# Patient Record
Sex: Female | Born: 1970 | Race: White | Hispanic: No | Smoking: Never smoker
Health system: Southern US, Community
[De-identification: ages and names within clinical notes are randomized; demographics above are authoritative.]

## PROBLEM LIST (undated history)

## (undated) DIAGNOSIS — F988 Other specified behavioral and emotional disorders with onset usually occurring in childhood and adolescence: Secondary | ICD-10-CM

## (undated) DIAGNOSIS — F419 Anxiety disorder, unspecified: Secondary | ICD-10-CM

## (undated) HISTORY — DX: Other specified behavioral and emotional disorders with onset usually occurring in childhood and adolescence: F98.8

## (undated) HISTORY — DX: Anxiety disorder, unspecified: F41.9

---

## 1998-01-26 ENCOUNTER — Other Ambulatory Visit: Admission: RE | Admit: 1998-01-26 | Discharge: 1998-01-26 | Payer: Self-pay | Admitting: Dermatology

## 1998-02-28 ENCOUNTER — Other Ambulatory Visit: Admission: RE | Admit: 1998-02-28 | Discharge: 1998-02-28 | Payer: Self-pay | Admitting: Dermatology

## 1998-03-30 ENCOUNTER — Other Ambulatory Visit: Admission: RE | Admit: 1998-03-30 | Discharge: 1998-03-30 | Payer: Self-pay | Admitting: Dermatology

## 1998-04-28 ENCOUNTER — Other Ambulatory Visit: Admission: RE | Admit: 1998-04-28 | Discharge: 1998-04-28 | Payer: Self-pay | Admitting: Dermatology

## 1998-08-07 ENCOUNTER — Other Ambulatory Visit: Admission: RE | Admit: 1998-08-07 | Discharge: 1998-08-07 | Payer: Self-pay | Admitting: Obstetrics and Gynecology

## 2000-01-29 ENCOUNTER — Other Ambulatory Visit: Admission: RE | Admit: 2000-01-29 | Discharge: 2000-01-29 | Payer: Self-pay | Admitting: Obstetrics and Gynecology

## 2001-03-23 ENCOUNTER — Other Ambulatory Visit: Admission: RE | Admit: 2001-03-23 | Discharge: 2001-03-23 | Payer: Self-pay | Admitting: *Deleted

## 2001-03-30 ENCOUNTER — Encounter: Admission: RE | Admit: 2001-03-30 | Discharge: 2001-03-30 | Payer: Self-pay | Admitting: Obstetrics and Gynecology

## 2001-03-30 ENCOUNTER — Encounter: Payer: Self-pay | Admitting: Obstetrics and Gynecology

## 2002-05-24 ENCOUNTER — Other Ambulatory Visit: Admission: RE | Admit: 2002-05-24 | Discharge: 2002-05-24 | Payer: Self-pay | Admitting: Obstetrics and Gynecology

## 2004-04-05 ENCOUNTER — Emergency Department (HOSPITAL_COMMUNITY): Admission: EM | Admit: 2004-04-05 | Discharge: 2004-04-05 | Payer: Self-pay | Admitting: Family Medicine

## 2004-07-10 ENCOUNTER — Other Ambulatory Visit: Admission: RE | Admit: 2004-07-10 | Discharge: 2004-07-10 | Payer: Self-pay | Admitting: Obstetrics and Gynecology

## 2005-10-16 ENCOUNTER — Other Ambulatory Visit: Admission: RE | Admit: 2005-10-16 | Discharge: 2005-10-16 | Payer: Self-pay | Admitting: Gynecology

## 2006-05-06 ENCOUNTER — Inpatient Hospital Stay (HOSPITAL_COMMUNITY): Admission: RE | Admit: 2006-05-06 | Discharge: 2006-05-09 | Payer: Self-pay | Admitting: Gynecology

## 2006-06-18 ENCOUNTER — Other Ambulatory Visit: Admission: RE | Admit: 2006-06-18 | Discharge: 2006-06-18 | Payer: Self-pay | Admitting: Gynecology

## 2006-06-27 ENCOUNTER — Encounter: Admission: RE | Admit: 2006-06-27 | Discharge: 2006-07-26 | Payer: Self-pay | Admitting: Gynecology

## 2006-07-27 ENCOUNTER — Encounter: Admission: RE | Admit: 2006-07-27 | Discharge: 2006-08-26 | Payer: Self-pay | Admitting: Gynecology

## 2006-08-27 ENCOUNTER — Encounter: Admission: RE | Admit: 2006-08-27 | Discharge: 2006-09-25 | Payer: Self-pay | Admitting: Gynecology

## 2006-09-26 ENCOUNTER — Encounter: Admission: RE | Admit: 2006-09-26 | Discharge: 2006-10-26 | Payer: Self-pay | Admitting: Gynecology

## 2006-10-27 ENCOUNTER — Encounter: Admission: RE | Admit: 2006-10-27 | Discharge: 2006-11-26 | Payer: Self-pay | Admitting: Gynecology

## 2006-11-27 ENCOUNTER — Encounter: Admission: RE | Admit: 2006-11-27 | Discharge: 2006-12-08 | Payer: Self-pay | Admitting: Gynecology

## 2008-10-20 ENCOUNTER — Other Ambulatory Visit: Admission: RE | Admit: 2008-10-20 | Discharge: 2008-10-20 | Payer: Self-pay | Admitting: Obstetrics and Gynecology

## 2011-03-01 NOTE — Discharge Summary (Signed)
Kristen Wood, Kristen Wood                    ACCOUNT NO.:  [id number]   MEDICAL RECORD NO.:  [id number]          PATIENT TYPE:  INP   LOCATION:  9128                          FACILITY:  [facility]   PHYSICIAN:  Ivor Costa. Farrel Gobble, M.D. DATE OF BIRTH:  January 01, 1971   DATE OF ADMISSION:  05/06/2006  DATE OF DISCHARGE:  05/09/2006                                 DISCHARGE SUMMARY   PRINCIPAL DIAGNOSIS:  Suspected cephalopelvic disproportion.   PRINCIPAL PROCEDURE:  Primary cesarean section.   HOSPITAL COURSE:  The patient presented in the afternoon of May 06, 2006,  to undergo a primary cesarean section that was elected secondary to fetal  head that measured greater than 42 weeks.  The patient underwent C-section  under spinal anesthesia for delivery of a viable female in the vertex  presentation with Apgars 9 and 9, birthweight 8#15, normal uterus, tubes and  ovaries and estimated blood loss of approximately 700 cc.  She was then  transferred to the recovery room in the postpartum floor in due fashion.  Her postpartum course was unremarkable.  The patient remained without  complaints.  By postop day #3, the patient was ready for discharge.  She was  voiding freely, tolerating regular diet and tolerating oral pain  medications.  The patient remained afebrile and vitals stable throughout.  Her uterus was firm, below the umbilicus.  Her incision was clean, dry and  intact.  Her extremities were nontender.  Postop labs were hemoglobin was  12, hematocrit 34.2, platelets 197,000, white count 8.5.  Postop discharge  condition was stable.  The patient was given discharge medications.  The  patient will use over-the-counter Motrin.  She was also given a prescription  for Tylox 1-2 q.6 h p.r.n. pain.      Ivor Costa. Farrel Gobble, M.D.  Electronically Signed     THL/MEDQ  D:  05/09/2006  T:  05/09/2006  Job:  [id number]

## 2011-03-01 NOTE — H&P (Signed)
Kristen Wood, Kristen Wood                    ACCOUNT NO.:  [id number]   MEDICAL RECORD NO.:  [id number]          PATIENT TYPE:  INP   LOCATION:  NA                            [facility]   PHYSICIAN:  Ivor Costa. Farrel Gobble, M.D. DATE OF BIRTH:  Feb 14, 1971   DATE OF ADMISSION:  DATE OF DISCHARGE:                                HISTORY & PHYSICAL   CHIEF COMPLAINT:  Suspected cephalopelvic disproportion.   HISTORY OF PRESENT ILLNESS:  The patient is a 40 year old, G-1 with  estimated date of confinement of 05/04/2006.  Estimated gestational age of  20 and 2/7 weeks, who presented last week to the office for a routine OB  visit.  The patient's vertex was noted to be high and when in the upright  position seemed to be overriding the pubic symphysis.  Review of the chart  had shown that an ultrasound done earlier at 36 weeks, showed the head  circumference BPD and OFD to be greater than the 98th percentile throughout,  the femur length only being in the 44th percentile.  Because of that the  patient then had an ultrasound done which again shows the head circumference  to be greater than 99th percentile, again with the femur length being 39th  percent.  With the vertex overriding the pubic symphysis and the large head  circumference, the patient was offered a Cesarean section.  She later called  back and wished to schedule the surgery.  The patient is otherwise without  any complaints, reports good fetal movement.  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  She is Rh negative as is  her husband.  Refer to the [place].  She is B negative, antibody negative,  RPR nonreactive, rubella negative.  Hepatitis B surface antigen nonreactive,  HIV nonreactive, GBS positive.  Refer to the [place].   PHYSICAL EXAMINATION:  She is a well-appearing gravida in no acute distress.  Her heart was regular rate.  Her lungs were clear.  Her abdomen was obese,  soft and nontender and  fetal heart tones were auscultated.  Her fundal  height was 39.  Again the vertex overrode the pubic symphysis.  On  examination she was fingertip, 70, very posterior and very high.  Extremities:  Negative.   ASSESSMENT:  Suspected cephalopelvic disproportion.  The patient will  present now for a primary Cesarean section.  Risks and benefits were  reviewed.  The patient is aware that there is a chance that she could  deliver this baby vaginally but does not elect to do so at this point.  All  questions were addressed.      Ivor Costa. Farrel Gobble, M.D.  Electronically Signed     THL/MEDQ  D:  05/06/2006  T:  05/06/2006  Job:  [id number]

## 2011-03-01 NOTE — Op Note (Signed)
NAMEJESSIAH, Kristen Wood                    ACCOUNT NO.:  [id number]   MEDICAL RECORD NO.:  [id number]          PATIENT TYPE:  INP   LOCATION:  9128                          [facility]   PHYSICIAN:  Ivor Costa. Farrel Gobble, M.D. DATE OF BIRTH:  [birth year]   DATE OF PROCEDURE:  05/06/2006  DATE OF DISCHARGE:                                 OPERATIVE REPORT   PREOPERATIVE DIAGNOSIS:  Suspected cephalopelvic disproportion.   POSTOPERATIVE DIAGNOSIS:  Suspected cephalopelvic disproportion.   PROCEDURE:  Primary cesarean section, low flap transverse.   SURGEON:  Ivor Costa. Farrel Gobble, M.D.   ANESTHESIA:  Spinal.   IV FLUIDS:  2600 mL lactated Ringer's.   ESTIMATED BLOOD LOSS:  700 mL.   URINE OUTPUT:  325 mL clear urine.   FINDINGS:  A viable female infant, clear amniotic fluid, Apgars 9/9, birth  weight 7 pounds 15 ounces.  Normal uterus, tubes and ovaries.   COMPLICATIONS:  None.   PATHOLOGY:  Placenta.   PROCEDURE:  The patient was taken to operating room, spinal anesthesia was  induced, and placed in the supine position left lateral displacement,  prep  and drape in the usual sterile fashion.  After adequate anesthesia was  assured, a Pfannenstiel skin incision was made with scalpel and carried  through the underlying layer of fascia with the Bovie.  The fascia was  scored in the midline and the incision was extended laterally with the  Bovie.  The inferior aspect of the fascial incision was grasped with Kochers  and the underlying rectus muscles were dissected off by blunt and sharp  dissection.  In a similar fashion, the superior aspect of the incision was  grasped with Kochers and the underlying rectus muscles were dissected off.  The rectus muscles were separated in the midline.  The peritoneum was  identified and entered bluntly.  The peritoneal incision was then extended  superiorly and inferiorly with good visualization of the underlying bowel  and bladder.  The bladder blade was  inserted.  The vesicouterine peritoneum  was identified, tented up, and entered sharply with the Metzenbaum scissors.  The incision was extended laterally.  A bladder flap was created digitally.  The bladder blade was then reinserted.  The lower uterine segment was  incised in a transverse fashion with the scalpel.  The incision was extended  bluntly.  The infant was delivered with the aid of baby Elliott's.  The cord  was cut and clamped and the infant was off to the awaiting pediatricians.  Cord bloods were obtained.  The uterus was massaged.  The placenta separated  naturally.  The uterus was then cleared of all clots and debris.  The  uterine incision was repaired with a running locked layer of 0 chromic, a  second suture was used for imbrication.  The pelvis was then irrigated with  copious amounts of warm saline.  The incision was noted to be hemostatic.  The tubes and ovaries were noted to be unremarkable.  The peritoneum,  muscles, and fascia were also noted to be hemostatic.  The fascia  was closed  with a 0 Vicryl in a running fashion.  The subcu was irrigated and  reapproximated with 3-0 plain.  The skin was closed with 4-0 Vicryl on a  Mellody Dance.  The patient tolerated the procedure well.  Sponge and needle counts  correct x2.  She was transferred to the PACU in stable condition.  She  received Ancef at cord clamp.      Ivor Costa. Farrel Gobble, M.D.  Electronically Signed     THL/MEDQ  D:  05/06/2006  T:  05/06/2006  Job:  [id number]

## 2012-03-03 ENCOUNTER — Other Ambulatory Visit: Payer: Self-pay | Admitting: Obstetrics and Gynecology

## 2012-03-03 DIAGNOSIS — Z1231 Encounter for screening mammogram for malignant neoplasm of breast: Secondary | ICD-10-CM

## 2012-03-31 ENCOUNTER — Ambulatory Visit
Admission: RE | Admit: 2012-03-31 | Discharge: 2012-03-31 | Disposition: A | Discharge status: Home / Self Care | Payer: BC Managed Care – PPO | Source: Ambulatory Visit | Attending: Obstetrics and Gynecology | Admitting: Obstetrics and Gynecology

## 2012-03-31 DIAGNOSIS — Z1231 Encounter for screening mammogram for malignant neoplasm of breast: Secondary | ICD-10-CM

## 2013-01-01 ENCOUNTER — Ambulatory Visit (INDEPENDENT_AMBULATORY_CARE_PROVIDER_SITE_OTHER): Payer: BC Managed Care – PPO | Admitting: Physician Assistant

## 2013-01-01 VITALS — BP 122/78 | HR 81 | Temp 98.2°F | Resp 18 | Ht 64.0 in | Wt 159.0 lb

## 2013-01-01 DIAGNOSIS — L03119 Cellulitis of unspecified part of limb: Secondary | ICD-10-CM

## 2013-01-01 DIAGNOSIS — L02519 Cutaneous abscess of unspecified hand: Secondary | ICD-10-CM

## 2013-01-01 DIAGNOSIS — L03114 Cellulitis of left upper limb: Secondary | ICD-10-CM

## 2013-01-01 LAB — POCT CBC
Granulocyte percent: 70.2 %G (ref 37–80)
HCT, POC: 45.8 % (ref 37.7–47.9)
Hemoglobin: 14.6 g/dL (ref 12.2–16.2)
Lymph, poc: 1.7 (ref 0.6–3.4)
MCH, POC: 31.9 pg — AB (ref 27–31.2)
MCHC: 31.9 g/dL (ref 31.8–35.4)
MCV: 99.9 fL — AB (ref 80–97)
MID (cbc): 0.6 (ref 0–0.9)
MPV: 8.6 fL (ref 0–99.8)
POC Granulocyte: 5.5 (ref 2–6.9)
POC LYMPH PERCENT: 22.4 %L (ref 10–50)
POC MID %: 7.4 %M (ref 0–12)
Platelet Count, POC: 271 10*3/uL (ref 142–424)
RBC: 4.58 M/uL (ref 4.04–5.48)
RDW, POC: 13 %
WBC: 7.8 10*3/uL (ref 4.6–10.2)

## 2013-01-01 MED ORDER — MUPIROCIN 2 % EX OINT: TOPICAL_OINTMENT | Freq: Three times a day (TID) | CUTANEOUS | Status: DC

## 2013-01-01 MED ORDER — DOXYCYCLINE HYCLATE 100 MG PO TABS: 100.0000 mg | ORAL_TABLET | Freq: Two times a day (BID) | ORAL | Status: DC

## 2013-01-01 NOTE — Progress Notes (Signed)
   [address], [place] [address]   Phone [phone]  Subjective:    Patient ID: Kristen Wood, female    DOB: [birth year], 42 y.o.   MRN: [id number]  HPI  Pt presents to clinic with area on L hand.  She noticed it yesterday and thinks it might be a spider bite because she moved plants the day before at her house.  She has some tightness in her hand which is causing some pain but no itching.  It is definitely swollen.  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  She feels well overall.  Review of Systems  Constitutional: Negative for fever and chills.  Skin: Positive for wound.       Objective:   Physical Exam  Vitals reviewed. Constitutional: She is oriented to person, place, and time. She appears well-developed and well-nourished.  HENT:  Head: Normocephalic and atraumatic.  Right Ear: External ear normal.  Left Ear: External ear normal.  Pulmonary/Chest: Effort normal.  Neurological: She is alert and oriented to person, place, and time.  Skin: Skin is warm and dry.  Small indurated area with surrounding swelling and erythema.  The center of the wound is covered in necrotic tissue that is removed and no purulence is expressed.  The wound edges are appear like dried granulation tissue.  This does not have the normal appearance of a MRSA infection.  There is a pustule just distal to the main wound that purulence is expressed and cultured.  The erythema is marked.  Psychiatric: She has a normal mood and affect. Her behavior is normal. Judgment and thought content normal.          Assessment & Plan:  Cellulitis of hand, left with pain - Pt to keep covered to protect herself and her clients.  She is to withhold from washing hair and be very careful about chemicals and her wound.  Plan: POCT CBC, Wound culture, doxycycline (VIBRA-TABS) 100 MG tablet, mupirocin ointment (BACTROBAN) 2 %  If her erythema increases or she starts to feel bad she will RTC for recheck. If no better in 3  days will RTC for recheck.

## 2013-01-01 NOTE — Patient Instructions (Addendum)
Recheck tomorrow if area of erythema is worse and outside of marked area.  If the area is not significantly improved by Monday recheck then.

## 2013-01-04 LAB — WOUND CULTURE
Gram Stain: NONE SEEN
Gram Stain: NONE SEEN

## 2013-02-02 ENCOUNTER — Ambulatory Visit: Payer: Self-pay | Admitting: Nurse Practitioner

## 2013-04-09 ENCOUNTER — Encounter: Payer: Self-pay | Admitting: *Deleted

## 2013-04-20 ENCOUNTER — Ambulatory Visit: Payer: BC Managed Care – PPO | Admitting: Nurse Practitioner

## 2013-06-22 ENCOUNTER — Ambulatory Visit (INDEPENDENT_AMBULATORY_CARE_PROVIDER_SITE_OTHER): Payer: BC Managed Care – PPO | Admitting: Nurse Practitioner

## 2013-06-22 ENCOUNTER — Encounter: Payer: Self-pay | Admitting: Nurse Practitioner

## 2013-06-22 VITALS — BP 120/84 | HR 72 | Resp 16 | Ht 64.5 in | Wt 161.0 lb

## 2013-06-22 DIAGNOSIS — Z01419 Encounter for gynecological examination (general) (routine) without abnormal findings: Secondary | ICD-10-CM

## 2013-06-22 DIAGNOSIS — Z0000 Encounter for general adult medical examination without abnormal findings: Secondary | ICD-10-CM

## 2013-06-22 DIAGNOSIS — Z23 Encounter for immunization: Secondary | ICD-10-CM

## 2013-06-22 LAB — COMPREHENSIVE METABOLIC PANEL
Albumin: 3.9 g/dL (ref 3.5–5.2)
Alkaline Phosphatase: 49 U/L (ref 39–117)
BUN: 6 mg/dL (ref 6–23)
CO2: 28 mEq/L (ref 19–32)
Calcium: 8.9 mg/dL (ref 8.4–10.5)
Chloride: 102 mEq/L (ref 96–112)
Glucose, Bld: 79 mg/dL (ref 70–99)
Potassium: 4.4 mEq/L (ref 3.5–5.3)
Sodium: 136 mEq/L (ref 135–145)
Total Protein: 6.3 g/dL (ref 6.0–8.3)

## 2013-06-22 LAB — LIPID PANEL
Cholesterol: 204 mg/dL — ABNORMAL HIGH (ref 0–200)
Triglycerides: 77 mg/dL (ref <150)
VLDL: 15 mg/dL (ref 0–40)

## 2013-06-22 LAB — POCT URINALYSIS DIPSTICK
Bilirubin, UA: NEGATIVE
Blood, UA: NEGATIVE
Glucose, UA: NEGATIVE
Leukocytes, UA: NEGATIVE
Nitrite, UA: NEGATIVE
Urobilinogen, UA: NEGATIVE

## 2013-06-22 LAB — HEMOGLOBIN, FINGERSTICK: Hemoglobin, fingerstick: 15.2 g/dL (ref 12.0–16.0)

## 2013-06-22 NOTE — Progress Notes (Signed)
Patient ID: Kristen Wood, female   DOB: 1971-08-27, 42 y.o.   MRN: [id number] 42 y.o. G1P1 Divorced Caucasian Fe here for annual exam.  She has been off birth control for 7 years.  Menses last for 3-4 days.  Moderate to light. No cramps and no PMS. Not dating or sexually active.  Divorce issues are some better.  Patient's last menstrual period was 06/10/2013.          Sexually active: no The current method of family planning is abstinence.    Exercising: no  The patient does not participate in regular exercise at present. Smoker:  no  Health Maintenance: Pap:  01/28/12, WNL neg HR HPV MMG:  03/31/12, BI-Rads 1: negative TDaP:  2000 will update Labs: HB: 15.2 Urine: normal   reports that she has never smoked. She does not have any smokeless tobacco history on file. She reports that she drinks about 2.0 ounces of alcohol per week. She reports that she uses illicit drugs.  Past Medical History  Diagnosis Date  . Anxiety     situational    Past Surgical History  Procedure Laterality Date  . Cesarean section    . Vaginal delivery      Current Outpatient Prescriptions  Medication Sig Dispense Refill  . doxycycline (VIBRA-TABS) 100 MG tablet Take 1 tablet (100 mg total) by mouth 2 (two) times daily.  20 tablet  0  . drospirenone-ethinyl estradiol (YASMIN,ZARAH,SYEDA) 3-0.03 MG tablet Take 1 tablet by mouth daily.      . mupirocin ointment (BACTROBAN) 2 % Apply topically 3 (three) times daily.  22 g  0  . tretinoin (RETIN-A) 0.025 % cream Apply topically at bedtime.       No current facility-administered medications for this visit.    Family History  Problem Relation Age of Onset  . Rheum arthritis Mother   . Asthma Mother   . Raynaud syndrome Sister   . Multiple births Maternal Grandfather   . Heart failure Paternal Grandfather     ROS:  Pertinent items are noted in HPI.  Otherwise, a comprehensive ROS was negative.  Exam:   BP 120/84  Pulse 72  Resp 16  Ht 5' 4.5" (1.638 m)   Wt 161 lb (73.029 kg)  BMI 27.22 kg/m2  LMP 06/10/2013 Height: 5' 4.5" (163.8 cm)  Ht Readings from Last 3 Encounters:  06/22/13 5' 4.5" (1.638 m)  01/01/13 5\' 4"  (1.626 m)    General appearance: alert, cooperative and appears stated age Head: Normocephalic, without obvious abnormality, atraumatic Neck: no adenopathy, supple, symmetrical, trachea midline and thyroid normal to inspection and palpation Lungs: clear to auscultation bilaterally Breasts: normal appearance, no masses or tenderness Heart: regular rate and rhythm Abdomen: soft, non-tender; no masses,  no organomegaly Extremities: extremities normal, atraumatic, no cyanosis or edema Skin: Skin color, texture, turgor normal. No rashes or lesions Lymph nodes: Cervical, supraclavicular, and axillary nodes normal. No abnormal inguinal nodes palpated Neurologic: Grossly normal   Pelvic: External genitalia:  no lesions              Urethra:  normal appearing urethra with no masses, tenderness or lesions              Bartholin's and Skene's: normal                 Vagina: normal appearing vagina with normal color and discharge, no lesions              Cervix: anteverted  Pap taken: no Bimanual Exam:  Uterus:  normal size, contour, position, consistency, mobility, non-tender              Adnexa: no mass, fullness, tenderness               Rectovaginal: Confirms               Anus:  normal sphincter tone, no lesions  A:  Well Woman with normal exam  Normal menses  Not sexually active  Gradual weight gain over several years  Situational stressors improved  Immunization update  P:   Pap smear as per guidelines Not done  Mammogram to be done this month  Routine labs and will follow  TDaP given today  Counseled on breast self exam, adequate intake of calcium and vitamin D, diet and exercise return annually or prn  An After Visit Summary was printed and given to the patient.

## 2013-06-22 NOTE — Patient Instructions (Signed)

## 2013-07-06 NOTE — Progress Notes (Signed)
Encounter reviewed by Dr. Tory Septer Silva.  

## 2013-08-03 ENCOUNTER — Other Ambulatory Visit: Payer: Self-pay

## 2013-08-03 DIAGNOSIS — Z1231 Encounter for screening mammogram for malignant neoplasm of breast: Secondary | ICD-10-CM

## 2013-09-15 ENCOUNTER — Ambulatory Visit
Admission: RE | Admit: 2013-09-15 | Discharge: 2013-09-15 | Disposition: A | Discharge status: Home / Self Care | Payer: BC Managed Care – PPO | Source: Ambulatory Visit

## 2013-09-15 DIAGNOSIS — Z1231 Encounter for screening mammogram for malignant neoplasm of breast: Secondary | ICD-10-CM

## 2013-10-06 ENCOUNTER — Encounter: Payer: Self-pay | Admitting: Nurse Practitioner

## 2014-06-23 ENCOUNTER — Ambulatory Visit: Payer: BC Managed Care – PPO | Admitting: Nurse Practitioner

## 2014-07-04 ENCOUNTER — Ambulatory Visit: Payer: BC Managed Care – PPO | Admitting: Nurse Practitioner

## 2014-08-15 ENCOUNTER — Encounter: Payer: Self-pay | Admitting: Nurse Practitioner

## 2014-09-20 ENCOUNTER — Ambulatory Visit: Payer: BC Managed Care – PPO | Admitting: Nurse Practitioner

## 2017-07-17 ENCOUNTER — Other Ambulatory Visit: Payer: Self-pay | Admitting: Podiatry

## 2017-07-17 ENCOUNTER — Ambulatory Visit (INDEPENDENT_AMBULATORY_CARE_PROVIDER_SITE_OTHER): Payer: BLUE CROSS/BLUE SHIELD

## 2017-07-17 ENCOUNTER — Encounter: Payer: Self-pay | Admitting: Podiatry

## 2017-07-17 ENCOUNTER — Ambulatory Visit (INDEPENDENT_AMBULATORY_CARE_PROVIDER_SITE_OTHER): Payer: BLUE CROSS/BLUE SHIELD | Admitting: Podiatry

## 2017-07-17 DIAGNOSIS — M79672 Pain in left foot: Secondary | ICD-10-CM

## 2017-07-17 DIAGNOSIS — M2042 Other hammer toe(s) (acquired), left foot: Secondary | ICD-10-CM

## 2017-07-17 DIAGNOSIS — M779 Enthesopathy, unspecified: Secondary | ICD-10-CM | POA: Diagnosis not present

## 2017-07-17 MED ORDER — TRIAMCINOLONE ACETONIDE 10 MG/ML IJ SUSP
10.0000 mg | Freq: Once | INTRAMUSCULAR | Status: AC
  Administered 2017-07-17: 10 mg

## 2017-07-17 MED ORDER — DICLOFENAC SODIUM 75 MG PO TBEC: 75.0000 mg | DELAYED_RELEASE_TABLET | Freq: Two times a day (BID) | ORAL | 2 refills | Status: DC

## 2017-07-17 NOTE — Progress Notes (Signed)
Subjective:    Patient ID: Kristen Wood, female   DOB: 46 y.o.   MRN: [id number]   HPI patient states she stubbed her left foot and she's had trouble with her second toe and she's not sure lifted then BEEN this way but she's getting a lot of pain in the joint and it's gradually gotten worse over the last month    Review of Systems  All other systems reviewed and are negative.       Objective:  Physical Exam  Constitutional: She appears well-developed and well-nourished.  Cardiovascular: Intact distal pulses.   Pulmonary/Chest: Effort normal.  Musculoskeletal: Normal range of motion.  Neurological: She is alert.  Skin: Skin is warm.  Nursing note and vitals reviewed.  neurovascular status intact muscle strength adequate range of motion within normal limits with quite a bit of inflammation and pain second MPJ left with fluid in the joint and significant dorsal excursion and rigid elevation of the second digit left which may be acute or may be present from a longer period of time. 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 At this time I did a proximal nerve block aspirated the joint and got out a small amount of fluid which did have a pink appearance to it indicating those a small amount of bleeding with possible flexor plate involvement and I carefully injected the joint with quarter cc deck some some Kenalog and applied thick pad to reduce pressure on the joint surface. Patient was placed on diclofenac 75 mg twice a day and will reappoint in 1 week  X-rays indicate there is quite a bit of dorsal excursion with rigid contracture digit 2 left

## 2017-07-25 ENCOUNTER — Encounter: Payer: Self-pay | Admitting: Podiatry

## 2017-07-25 ENCOUNTER — Ambulatory Visit (INDEPENDENT_AMBULATORY_CARE_PROVIDER_SITE_OTHER): Payer: BLUE CROSS/BLUE SHIELD | Admitting: Podiatry

## 2017-07-25 DIAGNOSIS — M779 Enthesopathy, unspecified: Secondary | ICD-10-CM | POA: Diagnosis not present

## 2017-07-25 DIAGNOSIS — M2042 Other hammer toe(s) (acquired), left foot: Secondary | ICD-10-CM

## 2017-07-25 NOTE — Progress Notes (Signed)
Subjective:    Patient ID: Kristen Wood, female   DOB: 46 y.o.   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 Patient will have orthotics crafted by Kristen Wood and we will make them with a horseshoe pad around the second metatarsal left disperse and reduced weightbearing pattern and will have metatarsal pads bilateral

## 2017-07-25 NOTE — Progress Notes (Signed)
   Subjective:    Patient ID: Kristen Wood, female    DOB: [birth year], 46 y.o.   MRN: [id number]  HPI    Review of Systems  All other systems reviewed and are negative.      Objective:   Physical Exam        Assessment & Plan:

## 2017-08-06 ENCOUNTER — Other Ambulatory Visit: Payer: BLUE CROSS/BLUE SHIELD | Admitting: Orthotics

## 2017-08-11 ENCOUNTER — Other Ambulatory Visit: Payer: BLUE CROSS/BLUE SHIELD | Admitting: Orthotics

## 2020-02-23 ENCOUNTER — Other Ambulatory Visit: Payer: Self-pay | Admitting: Family Medicine

## 2020-02-23 DIAGNOSIS — Z1231 Encounter for screening mammogram for malignant neoplasm of breast: Secondary | ICD-10-CM

## 2020-03-08 ENCOUNTER — Ambulatory Visit
Admission: RE | Admit: 2020-03-08 | Discharge: 2020-03-08 | Disposition: A | Discharge status: Home / Self Care | Payer: BC Managed Care – PPO | Source: Ambulatory Visit | Attending: Family Medicine | Admitting: Family Medicine

## 2020-03-08 ENCOUNTER — Other Ambulatory Visit: Payer: Self-pay

## 2020-03-08 DIAGNOSIS — Z1231 Encounter for screening mammogram for malignant neoplasm of breast: Secondary | ICD-10-CM

## 2020-04-12 NOTE — Progress Notes (Signed)
49 y.o. G1P1 Divorced White or Caucasian female here for annual exam.  States "I've neglected myself for several years".    Cycles have been changing over the past two years.  Typically, they are every 28 days.  She does have cycles that are now a few weeks late.  When it is late, cycle can be longer or shorter.    She's not using contraception.  Has used OCPs in the past.  Does not faithfully use condoms.    Had Covid in April.  Still with some symptoms.  Fatigue is a big one.    Patient's last menstrual period was 04/02/2020 (exact date).          Sexually active: Yes.    The current method of family planning is none.    Exercising: No.  exercise Smoker:  no  Health Maintenance: Pap:  01-28-12 neg HPV HR neg History of abnormal Pap:  no MMG:  03-08-2020 category b density birads 1:neg Colonoscopy:  Guidelines reviewed BMD:   none TDaP:  2014, may have had update Screening Labs: just done with Dr. Cyndia Bent   reports that she has never smoked. She has never used smokeless tobacco. She reports current alcohol use of about 4.0 standard drinks of alcohol per week. She reports that she does not use drugs.  Past Medical History:  Diagnosis Date  . ADD (attention deficit disorder)   . Anxiety    situational    Past Surgical History:  Procedure Laterality Date  . CESAREAN SECTION  05/06/06    Current Outpatient Medications  Medication Sig Dispense Refill  . ALPRAZolam (XANAX) 0.5 MG tablet Take 0.5 mg by mouth daily as needed.    Marland Kitchen amphetamine-dextroamphetamine (ADDERALL) 30 MG tablet Take by mouth.     No current facility-administered medications for this visit.    Family History  Problem Relation Age of Onset  . Rheum arthritis Mother   . Asthma Mother   . Raynaud syndrome Sister   . Heart failure Paternal Grandfather   . Hypertension Brother     Review of Systems  Constitutional: Negative.   HENT: Negative.   Eyes: Negative.   Respiratory: Negative.   Cardiovascular:  Negative.   Gastrointestinal: Negative.   Endocrine: Negative.   Genitourinary: Negative.   Musculoskeletal: Negative.   Skin: Negative.   Allergic/Immunologic: Negative.   Neurological: Negative.   Hematological: Negative.   Psychiatric/Behavioral: Negative.     Exam:   Ht 5' 4.25" (1.632 m)   Wt 182 lb (82.6 kg)   LMP 04/02/2020 (Exact Date)   BMI 31.00 kg/m   Height: 5' 4.25" (163.2 cm)  General appearance: alert, cooperative and appears stated age Head: Normocephalic, without obvious abnormality, atraumatic Neck: no adenopathy, supple, symmetrical, trachea midline and thyroid normal to inspection and palpation Lungs: clear to auscultation bilaterally Breasts: normal appearance, no masses or tenderness Heart: regular rate and rhythm Abdomen: soft, non-tender; bowel sounds normal; no masses,  no organomegaly Extremities: extremities normal, atraumatic, no cyanosis or edema Skin: Skin color, texture, turgor normal. No rashes or lesions Lymph nodes: Cervical, supraclavicular, and axillary nodes normal. No abnormal inguinal nodes palpated Neurologic: Grossly normal   Pelvic: External genitalia:  no lesions              Urethra:  normal appearing urethra with no masses, tenderness or lesions              Bartholins and Skenes: normal  Vagina: normal appearing vagina with normal color and discharge, no lesions              Cervix: no lesions              Pap taken: Yes.   Bimanual Exam:  Uterus:  normal size, contour, position, consistency, mobility, non-tender              Adnexa: normal adnexa and no mass, fullness, tenderness               Rectovaginal: Confirms               Anus:  normal sphincter tone, no lesions  Chaperone, Cornelia Copa, CMA, was present for exam.  A:  Well Woman with normal exam Using no contraception, declines Covid infection in April, still with some symptoms Mildly elevated lipids  P:   Mammogram guidelines reviewed.  Up to  date. pap smear with HR HPV obtained today Blood work just done with Dr. Cyndia Bent Colon cancer screening guidelines reviewed.  Referral placed today. Other vaccines reviewed Return annually or prn

## 2020-04-20 ENCOUNTER — Other Ambulatory Visit (HOSPITAL_COMMUNITY)
Admission: RE | Admit: 2020-04-20 | Discharge: 2020-04-20 | Disposition: A | Discharge status: Home / Self Care | Payer: BC Managed Care – PPO | Source: Ambulatory Visit | Attending: Obstetrics & Gynecology | Admitting: Obstetrics & Gynecology

## 2020-04-20 ENCOUNTER — Ambulatory Visit (INDEPENDENT_AMBULATORY_CARE_PROVIDER_SITE_OTHER): Payer: BC Managed Care – PPO | Admitting: Obstetrics & Gynecology

## 2020-04-20 ENCOUNTER — Other Ambulatory Visit: Payer: Self-pay

## 2020-04-20 ENCOUNTER — Encounter: Payer: Self-pay | Admitting: Obstetrics & Gynecology

## 2020-04-20 VITALS — BP 110/72 | HR 68 | Resp 16 | Ht 64.25 in | Wt 182.0 lb

## 2020-04-20 DIAGNOSIS — Z124 Encounter for screening for malignant neoplasm of cervix: Secondary | ICD-10-CM | POA: Diagnosis present

## 2020-04-20 DIAGNOSIS — Z1211 Encounter for screening for malignant neoplasm of colon: Secondary | ICD-10-CM | POA: Diagnosis not present

## 2020-04-20 DIAGNOSIS — Z01419 Encounter for gynecological examination (general) (routine) without abnormal findings: Secondary | ICD-10-CM | POA: Diagnosis not present

## 2020-04-21 ENCOUNTER — Encounter: Payer: Self-pay | Admitting: Internal Medicine

## 2020-04-21 LAB — CYTOLOGY - PAP
Comment: NEGATIVE
Diagnosis: NEGATIVE
High risk HPV: NEGATIVE

## 2020-07-13 ENCOUNTER — Encounter: Payer: BC Managed Care – PPO | Admitting: Internal Medicine

## 2020-08-02 ENCOUNTER — Telehealth: Payer: Self-pay

## 2020-08-02 ENCOUNTER — Encounter: Payer: Self-pay | Admitting: Nurse Practitioner

## 2020-08-02 DIAGNOSIS — Z1211 Encounter for screening for malignant neoplasm of colon: Secondary | ICD-10-CM

## 2020-08-02 NOTE — Telephone Encounter (Signed)
Spoke with pt. Pt states needing new referral to GI for colonoscopy. Pt states was placed with another provider at Dr Marvell Fuller office and does not want to see him due to personal reasons. Pt asking to see Dr Loreta Ave at [facility]. Pt advised will send new referral to Dr Loreta Ave and pt will be called for an appointment or may call early next week. Pt agreeable and thankful for referral.  Routing to Dr Hyacinth Meeker for update Encounter closed Cc: Hayley for referral while Clotilde Dieter is out.  Orders placed
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
## 2020-08-21 ENCOUNTER — Encounter: Payer: BC Managed Care – PPO | Admitting: Internal Medicine

## 2020-10-17 DIAGNOSIS — Z1211 Encounter for screening for malignant neoplasm of colon: Secondary | ICD-10-CM | POA: Diagnosis not present

## 2020-10-17 DIAGNOSIS — K625 Hemorrhage of anus and rectum: Secondary | ICD-10-CM | POA: Diagnosis not present

## 2020-10-17 DIAGNOSIS — E669 Obesity, unspecified: Secondary | ICD-10-CM | POA: Diagnosis not present

## 2020-11-27 DIAGNOSIS — Z1211 Encounter for screening for malignant neoplasm of colon: Secondary | ICD-10-CM | POA: Diagnosis not present

## 2021-02-08 DIAGNOSIS — F411 Generalized anxiety disorder: Secondary | ICD-10-CM | POA: Diagnosis not present

## 2021-02-08 DIAGNOSIS — F900 Attention-deficit hyperactivity disorder, predominantly inattentive type: Secondary | ICD-10-CM | POA: Diagnosis not present

## 2021-05-14 ENCOUNTER — Encounter (HOSPITAL_BASED_OUTPATIENT_CLINIC_OR_DEPARTMENT_OTHER): Payer: Self-pay | Admitting: Obstetrics and Gynecology

## 2021-05-14 ENCOUNTER — Emergency Department (HOSPITAL_BASED_OUTPATIENT_CLINIC_OR_DEPARTMENT_OTHER)
Admission: EM | Admit: 2021-05-14 | Discharge: 2021-05-14 | Disposition: A | Discharge status: Home / Self Care | Payer: BC Managed Care – PPO | Attending: Emergency Medicine | Admitting: Emergency Medicine

## 2021-05-14 ENCOUNTER — Emergency Department (HOSPITAL_BASED_OUTPATIENT_CLINIC_OR_DEPARTMENT_OTHER): Payer: BC Managed Care – PPO

## 2021-05-14 ENCOUNTER — Other Ambulatory Visit: Payer: Self-pay

## 2021-05-14 DIAGNOSIS — S060X0A Concussion without loss of consciousness, initial encounter: Secondary | ICD-10-CM | POA: Insufficient documentation

## 2021-05-14 DIAGNOSIS — R42 Dizziness and giddiness: Secondary | ICD-10-CM | POA: Diagnosis not present

## 2021-05-14 DIAGNOSIS — I10 Essential (primary) hypertension: Secondary | ICD-10-CM | POA: Insufficient documentation

## 2021-05-14 DIAGNOSIS — W07XXXA Fall from chair, initial encounter: Secondary | ICD-10-CM | POA: Diagnosis not present

## 2021-05-14 DIAGNOSIS — R519 Headache, unspecified: Secondary | ICD-10-CM | POA: Diagnosis not present

## 2021-05-14 DIAGNOSIS — S0990XA Unspecified injury of head, initial encounter: Secondary | ICD-10-CM | POA: Diagnosis not present

## 2021-05-14 DIAGNOSIS — S0003XA Contusion of scalp, initial encounter: Secondary | ICD-10-CM | POA: Diagnosis not present
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
## 2021-05-14 NOTE — ED Provider Notes (Signed)
[facility] EMERGENCY DEPT Provider Note   CSN: [id number] Arrival date & time: 05/14/21  1703     History Chief Complaint  Patient presents with   Fall    Theodosia Marjoria Mancillas is a 50 y.o. female.  Patient is a 50 year old female with a history of anxiety who is presenting today due to ongoing headache after she on Saturday went to sit in a chair and it broke causing her to fall backwards smacking her head on a counter.  She saw stars but did not have loss of consciousness.  She went home immediately and was very sedentary on Sunday but went to work today and reports worsening headache and nausea.  She does not take anticoagulation.  The history is provided by the patient.  Fall This is a new problem. The current episode started 2 days ago. The problem occurs constantly. The problem has been gradually worsening. Associated symptoms include headaches. Associated symptoms comments: Mild nausea.  No difficulty walking, visual changes, dizziness, unilateral numbness or weakness.. Nothing aggravates the symptoms. Relieved by: Improved with rest. She has tried acetaminophen for the symptoms. The treatment provided no relief.      Past Medical History:  Diagnosis Date   ADD (attention deficit disorder)    Anxiety    situational    There are no problems to display for this patient.   Past Surgical History:  Procedure Laterality Date   CESAREAN SECTION  05/06/06     OB History     Gravida  1   Para  1   Term      Preterm      AB      Living  1      SAB      IAB      Ectopic      Multiple      Live Births              Family History  Problem Relation Age of Onset   Rheum arthritis Mother    Asthma Mother    Raynaud syndrome Sister    Heart failure Paternal Grandfather    Hypertension Brother     Social History   Tobacco Use   Smoking status: Never   Smokeless tobacco: Never  Vaping Use   Vaping Use: Never used  Substance Use Topics    Alcohol use: Yes    Alcohol/week: 4.0 standard drinks    Types: 4 Standard drinks or equivalent per week   Drug use: No    Home Medications Prior to Admission medications   Medication Sig Start Date End Date Taking? Authorizing Provider  ALPRAZolam Prudy Feeler) 0.5 MG tablet Take 0.5 mg by mouth daily as needed. 04/10/20   [provider]  amphetamine-dextroamphetamine (ADDERALL) 30 MG tablet Take by mouth. 04/10/20   [provider]    Allergies    Patient has no known allergies.  Review of Systems   Review of Systems  Neurological:  Positive for headaches.  All other systems reviewed and are negative.  Physical Exam Updated Vital Signs BP (!) 201/112 (BP Location: Right Arm)   Pulse 98   Temp 99 F (37.2 C)   Resp 18   LMP 05/10/2021 (Exact Date)   SpO2 100%   Physical Exam Vitals and nursing note reviewed.  Constitutional:      General: She is not in acute distress.    Appearance: Normal appearance. She is well-developed.  HENT:     Head: Normocephalic  and atraumatic.     Right Ear: Tympanic membrane normal.     Left Ear: Tympanic membrane normal.     Mouth/Throat:     Mouth: Mucous membranes are moist.  Eyes:     Extraocular Movements: Extraocular movements intact.     Conjunctiva/sclera: Conjunctivae normal.     Pupils: Pupils are equal, round, and reactive to light.  Cardiovascular:     Rate and Rhythm: Normal rate and regular rhythm.     Heart sounds: Normal heart sounds. No murmur heard.   No friction rub.  Pulmonary:     Effort: Pulmonary effort is normal.     Breath sounds: Normal breath sounds. No wheezing or rales.  Abdominal:     General: Bowel sounds are normal. There is no distension.     Palpations: Abdomen is soft.     Tenderness: There is no abdominal tenderness. There is no guarding or rebound.  Musculoskeletal:        General: No tenderness. Normal range of motion.     Cervical back: Normal range of motion and neck supple.  No tenderness.     Comments: No edema  Skin:    General: Skin is warm and dry.     Findings: No rash.  Neurological:     Mental Status: She is alert and oriented to person, place, and time. Mental status is at baseline.     Cranial Nerves: No cranial nerve deficit.     Sensory: No sensory deficit.     Motor: No weakness.     Coordination: Coordination normal.     Gait: Gait normal.  Psychiatric:        Mood and Affect: Mood normal.        Behavior: Behavior normal.    ED Results / Procedures / Treatments   Labs (all labs ordered are listed, but only abnormal results are displayed) Labs Reviewed - No data to display  EKG None  Radiology CT HEAD WO CONTRAST ( )  Result Date: 05/14/2021 CLINICAL DATA:  Head trauma, minor, normal mental status (Age 22-64y) Struck back of head on Saturday night. Increasing headache and dizziness. EXAM: CT HEAD WITHOUT CONTRAST TECHNIQUE: Contiguous axial images were obtained from the base of the skull through the vertex without intravenous contrast. COMPARISON:  None. FINDINGS: Brain: No intracranial hemorrhage, mass effect, or midline shift. No hydrocephalus. The basilar cisterns are patent. No evidence of territorial infarct or acute ischemia. No extra-axial or intracranial fluid collection. Vascular: No hyperdense vessel or unexpected calcification. Skull: No fracture or focal lesion. Sinuses/Orbits: Minimal opacification of right ethmoid air cells. No acute fracture. Mastoid air cells are clear. Included orbits are unremarkable. Other: No confluent scalp hematoma. IMPRESSION: No acute intracranial abnormality. No skull fracture. Electronically Signed   By: Narda Rutherford M.D.   On: 05/14/2021 18:05    Procedures Procedures   Medications Ordered in ED Medications - No data to display  ED Course  I have reviewed the triage vital signs and the nursing notes.  Pertinent labs & imaging results that were available during my care of the patient were  reviewed by me and considered in my medical decision making (see chart for details).    MDM Rules/Calculators/A&P                           Patient presenting today after having a head injury 2 days ago with ongoing symptoms that have gradually worsened.  Patient's head  CT negative for acute abnormality but do feel that patient has a concussion.  She is neurovascularly intact at this time.  Encouraged brain rest and concussion precautions.  Patient also noted to be hypertensive here.  She reports for the last year when she is gone to the doctor they have reported that she has an elevated blood pressure and have had to check it several times.  Discussed this with her and encouraged her to follow-up with her PCP as she will have to have repeat blood pressure checks.  Patient given dietary instructions for hypertension.  Also given return precautions  MDM   Amount and/or Complexity of Data Reviewed Tests in the radiology section of CPT: ordered and reviewed Independent visualization of images, tracings, or specimens: yes    Final Clinical Impression(s) / ED Diagnoses Final diagnoses:  Concussion without loss of consciousness, initial encounter  Hypertension, unspecified type    Rx / DC Orders ED Discharge Orders     None        Gwyneth Sprout, MD 05/14/21 1945

## 2021-06-05 ENCOUNTER — Ambulatory Visit: Payer: BC Managed Care – PPO

## 2021-06-27 ENCOUNTER — Ambulatory Visit (INDEPENDENT_AMBULATORY_CARE_PROVIDER_SITE_OTHER): Payer: BC Managed Care – PPO | Admitting: Obstetrics & Gynecology

## 2021-06-27 ENCOUNTER — Other Ambulatory Visit: Payer: Self-pay

## 2021-06-27 ENCOUNTER — Ambulatory Visit (HOSPITAL_BASED_OUTPATIENT_CLINIC_OR_DEPARTMENT_OTHER)
Admission: RE | Admit: 2021-06-27 | Discharge: 2021-06-27 | Disposition: A | Discharge status: Home / Self Care | Payer: BC Managed Care – PPO | Source: Ambulatory Visit | Attending: Obstetrics & Gynecology | Admitting: Obstetrics & Gynecology

## 2021-06-27 ENCOUNTER — Encounter (HOSPITAL_BASED_OUTPATIENT_CLINIC_OR_DEPARTMENT_OTHER): Payer: Self-pay | Admitting: Obstetrics & Gynecology

## 2021-06-27 VITALS — BP 148/94 | HR 57 | Ht 64.5 in | Wt 176.4 lb

## 2021-06-27 DIAGNOSIS — E785 Hyperlipidemia, unspecified: Secondary | ICD-10-CM

## 2021-06-27 DIAGNOSIS — Z01419 Encounter for gynecological examination (general) (routine) without abnormal findings: Secondary | ICD-10-CM

## 2021-06-27 DIAGNOSIS — Z1231 Encounter for screening mammogram for malignant neoplasm of breast: Secondary | ICD-10-CM

## 2021-06-27 DIAGNOSIS — Z8742 Personal history of other diseases of the female genital tract: Secondary | ICD-10-CM

## 2021-06-27 DIAGNOSIS — Z309 Encounter for contraceptive management, unspecified: Secondary | ICD-10-CM

## 2021-06-27 NOTE — Progress Notes (Signed)
50 y.o. G1P1 Divorced White or Caucasian female here for annual exam.  Did have a fall from a chair at work.  Did end up going to the ER after experiencing a headache.  CT scan was normal.    Still having menstrual cycles but they are less frequent.  Last cycle was July 21.  Does feel hot in general but doesn't have hot flashes.  Does feel more emotionally labile.    Patient's last menstrual period was 05/03/2021.          Sexually active: Yes.    The current method of family planning is none.    Exercising: No.  Smoker:  no  Health Maintenance: Pap:  04/20/2020 Negative History of abnormal Pap:  no MMG:  03/08/2020 Negative Colonoscopy:  11/27/2020 BMD:   not indicated TDaP:  2014 Shingrix:   discussed Hep C testing: discussed today Screening Labs: discussed today   reports that she has never smoked. She has never used smokeless tobacco. She reports current alcohol use of about 4.0 standard drinks per week. She reports that she does not use drugs.  Past Medical History:  Diagnosis Date   ADD (attention deficit disorder)    Anxiety    situational    Past Surgical History:  Procedure Laterality Date   CESAREAN SECTION  05/06/06    Current Outpatient Medications  Medication Sig Dispense Refill   ALPRAZolam (XANAX) 0.5 MG tablet Take 0.5 mg by mouth daily as needed.     amphetamine-dextroamphetamine (ADDERALL) 30 MG tablet Take by mouth.     No current facility-administered medications for this visit.    Family History  Problem Relation Age of Onset   Rheum arthritis Mother    Asthma Mother    Raynaud syndrome Sister    Heart failure Paternal Grandfather    Hypertension Brother     Review of Systems  HENT:  Trouble swallowing: elevated blood pressure.   All other systems reviewed and are negative.  Exam:   BP (!) 148/94 (BP Location: Right Arm, Patient Position: Sitting, Cuff Size: Small)   Pulse (!) 57   Ht 5' 4.5" (1.638 m)   Wt 176 lb 6.4 oz (80 kg)   LMP  05/03/2021   BMI 29.81 kg/m   Height: 5' 4.5" (163.8 cm)  General appearance: alert, cooperative and appears stated age Head: Normocephalic, without obvious abnormality, atraumatic Neck: no adenopathy, supple, symmetrical, trachea midline and thyroid normal to inspection and palpation Lungs: clear to auscultation bilaterally Breasts: normal appearance, no masses or tenderness Heart: regular rate and rhythm Abdomen: soft, non-tender; bowel sounds normal; no masses,  no organomegaly Extremities: extremities normal, atraumatic, no cyanosis or edema Skin: Skin color, texture, turgor normal. No rashes or lesions Lymph nodes: Cervical, supraclavicular, and axillary nodes normal. No abnormal inguinal nodes palpated Neurologic: Grossly normal  Pelvic: External genitalia:  no lesions              Urethra:  normal appearing urethra with no masses, tenderness or lesions              Bartholins and Skenes: normal                 Vagina: normal appearing vagina with normal color and no discharge, no lesions              Cervix: no lesions              Pap taken: No. Bimanual Exam:  Uterus:  normal size,  contour, position, consistency, mobility, non-tender              Adnexa: normal adnexa and no mass, fullness, tenderness               Rectovaginal: Confirms               Anus:  normal sphincter tone, no lesions  Chaperone, Ina Homes, CMA, was present for exam.  Assessment/Plan: 1. Well woman exam with routine gynecological exam - Pap smear  neg 04/2020.  Not indicated today. - Mammogram 03/08/2020 - Colonoscopy 11/27/2020 - Bone mineral density not indicated - lab work done done with PCP - vaccines reviewed/updated - contraception was discussed and she is comfortable with current method of occasional condoms  2. History of irregular menstrual cycles - Follicle stimulating hormone  3. Elevated lipids  4.  Elevated BP today

## 2021-06-28 LAB — FOLLICLE STIMULATING HORMONE: FSH: 8 m[IU]/mL

## 2021-07-04 ENCOUNTER — Telehealth (HOSPITAL_BASED_OUTPATIENT_CLINIC_OR_DEPARTMENT_OTHER): Payer: Self-pay | Admitting: Obstetrics & Gynecology

## 2021-07-04 NOTE — Telephone Encounter (Signed)
Patient called and stated she was returning Selena Batten the nurse call.

## 2021-08-02 DIAGNOSIS — F900 Attention-deficit hyperactivity disorder, predominantly inattentive type: Secondary | ICD-10-CM | POA: Diagnosis not present

## 2021-08-02 DIAGNOSIS — F411 Generalized anxiety disorder: Secondary | ICD-10-CM | POA: Diagnosis not present

## 2021-08-03 ENCOUNTER — Telehealth (HOSPITAL_BASED_OUTPATIENT_CLINIC_OR_DEPARTMENT_OTHER): Payer: Self-pay

## 2021-08-03 ENCOUNTER — Other Ambulatory Visit (HOSPITAL_BASED_OUTPATIENT_CLINIC_OR_DEPARTMENT_OTHER): Payer: Self-pay | Admitting: Obstetrics & Gynecology

## 2021-08-03 MED ORDER — MEDROXYPROGESTERONE ACETATE 10 MG PO TABS: 10.0000 mg | ORAL_TABLET | Freq: Every day | ORAL | 0 refills | Status: DC

## 2021-08-03 NOTE — Telephone Encounter (Signed)
Patient called today and wanted Dr. Hyacinth Wood to know that she has not started her period. She states that she was told if she had not started her period by 10/21 that we would call in progesterone for her to take. Please advise. tbw

## 2021-08-07 DIAGNOSIS — Z6830 Body mass index (BMI) 30.0-30.9, adult: Secondary | ICD-10-CM | POA: Diagnosis not present

## 2021-08-07 DIAGNOSIS — Z0000 Encounter for general adult medical examination without abnormal findings: Secondary | ICD-10-CM | POA: Diagnosis not present

## 2021-08-07 DIAGNOSIS — F909 Attention-deficit hyperactivity disorder, unspecified type: Secondary | ICD-10-CM | POA: Diagnosis not present

## 2021-08-07 DIAGNOSIS — F419 Anxiety disorder, unspecified: Secondary | ICD-10-CM | POA: Diagnosis not present

## 2021-08-07 DIAGNOSIS — E782 Mixed hyperlipidemia: Secondary | ICD-10-CM | POA: Diagnosis not present

## 2021-08-07 DIAGNOSIS — I10 Essential (primary) hypertension: Secondary | ICD-10-CM | POA: Insufficient documentation

## 2021-08-08 NOTE — Telephone Encounter (Signed)
Called and spoke with patient about the below message per Dr. Hyacinth Meeker. Patient expresses understanding and will call and let us know when she starts bleeding. tbw

## 2021-08-14 DIAGNOSIS — E782 Mixed hyperlipidemia: Secondary | ICD-10-CM | POA: Insufficient documentation

## 2021-08-16 ENCOUNTER — Other Ambulatory Visit (HOSPITAL_BASED_OUTPATIENT_CLINIC_OR_DEPARTMENT_OTHER): Payer: Self-pay | Admitting: Obstetrics & Gynecology

## 2021-08-21 DIAGNOSIS — Z1329 Encounter for screening for other suspected endocrine disorder: Secondary | ICD-10-CM | POA: Diagnosis not present

## 2022-01-24 DIAGNOSIS — F900 Attention-deficit hyperactivity disorder, predominantly inattentive type: Secondary | ICD-10-CM | POA: Diagnosis not present

## 2022-01-24 DIAGNOSIS — F411 Generalized anxiety disorder: Secondary | ICD-10-CM | POA: Diagnosis not present

## 2022-04-05 DIAGNOSIS — W57XXXA Bitten or stung by nonvenomous insect and other nonvenomous arthropods, initial encounter: Secondary | ICD-10-CM | POA: Diagnosis not present

## 2022-04-05 DIAGNOSIS — I10 Essential (primary) hypertension: Secondary | ICD-10-CM | POA: Diagnosis not present

## 2022-04-05 DIAGNOSIS — S1096XA Insect bite of unspecified part of neck, initial encounter: Secondary | ICD-10-CM | POA: Diagnosis not present

## 2022-07-04 IMAGING — MG MM DIGITAL SCREENING BILAT W/ TOMO AND CAD
8 series · 8 of 24 positions shown · non-contrast
Comparison: Previous exam(s).

CLINICAL DATA: Screening.

EXAM:
DIGITAL SCREENING BILATERAL MAMMOGRAM WITH TOMOSYNTHESIS AND CAD
TECHNIQUE: Bilateral screening digital craniocaudal and mediolateral oblique
mammograms were obtained. Bilateral screening digital breast
tomosynthesis was performed. The images were evaluated with
computer-aided detection.

[R MLO synth-2D]
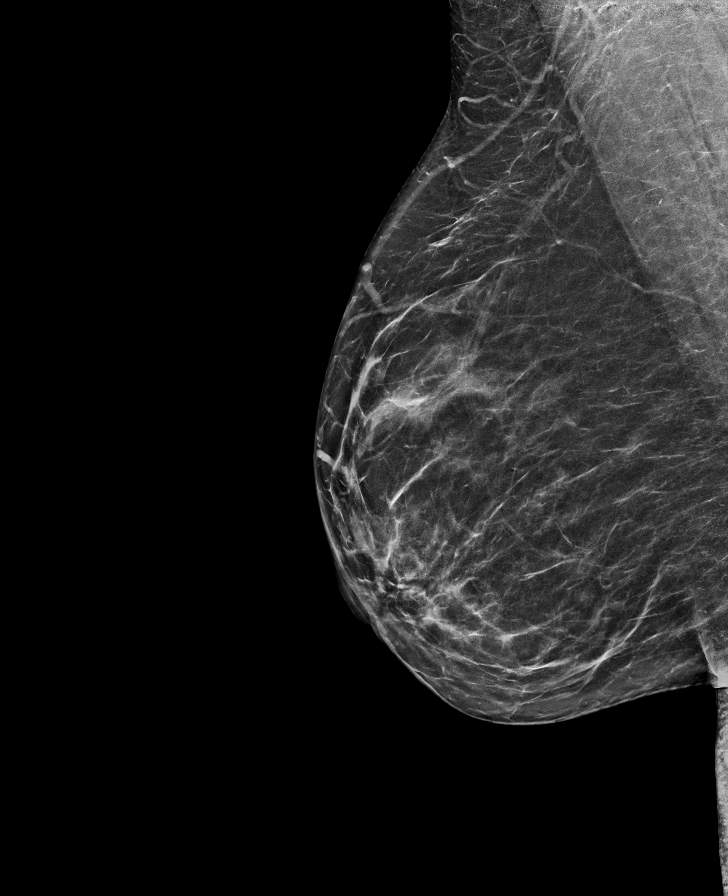

[L CC synth-2D]
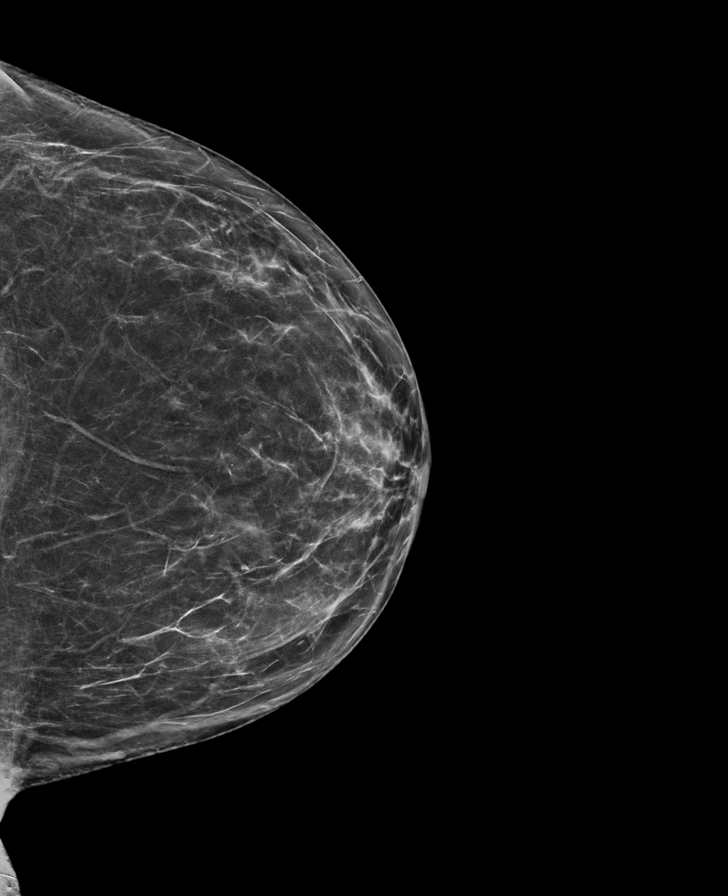

[L MLO synth-2D]
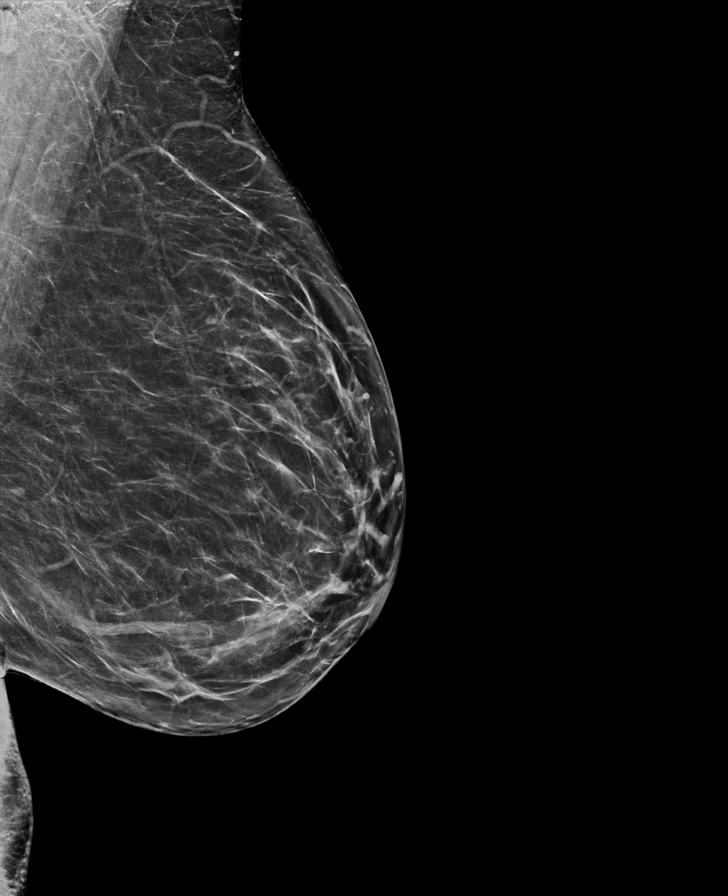

[R CC synth-2D]
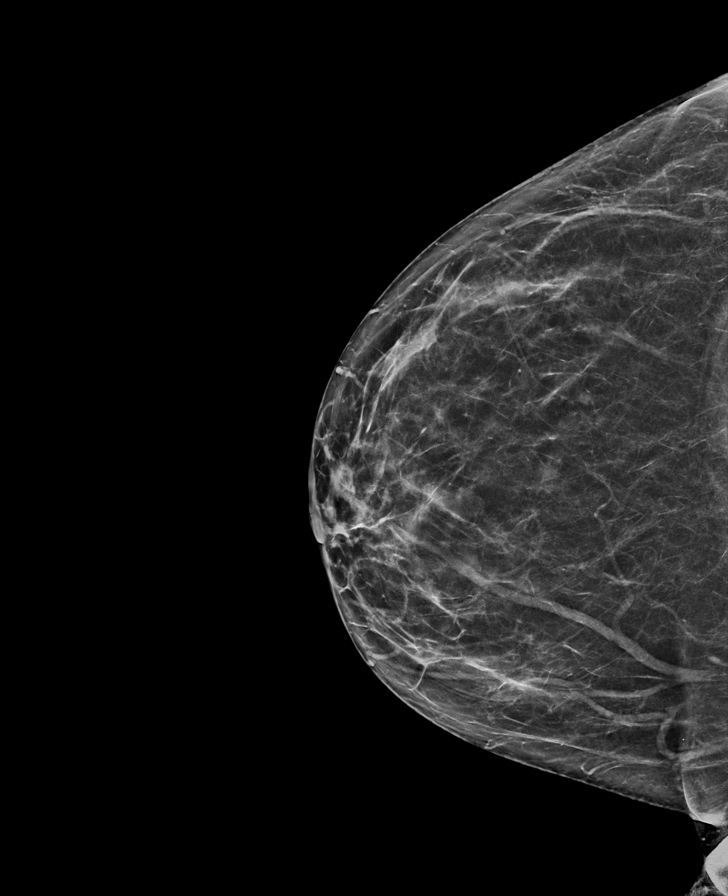

[L CC tomo · tomo slice 36/71.0]
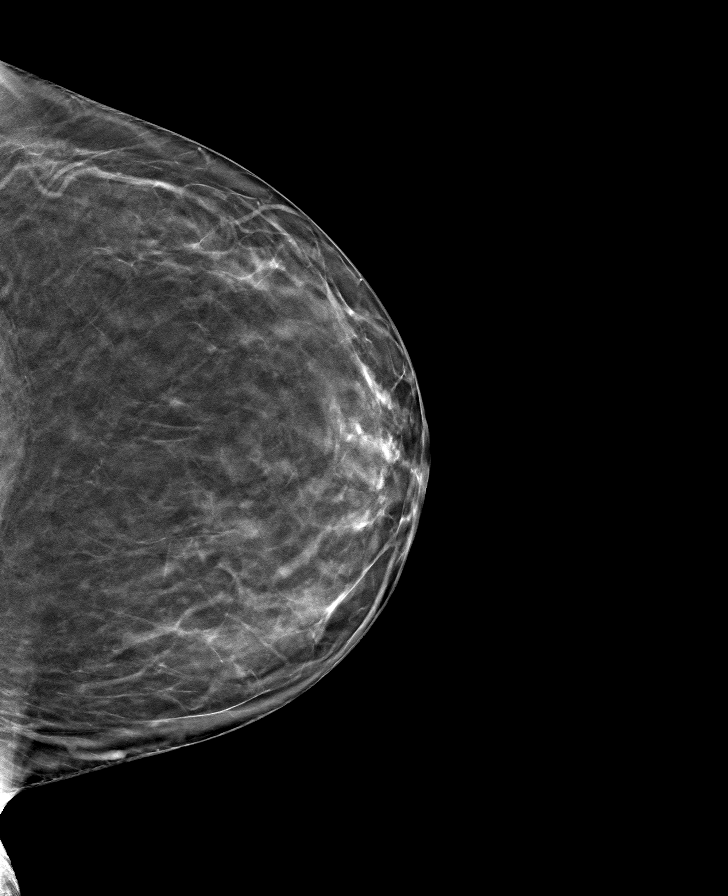

[R MLO tomo · tomo slice 36/71.0]
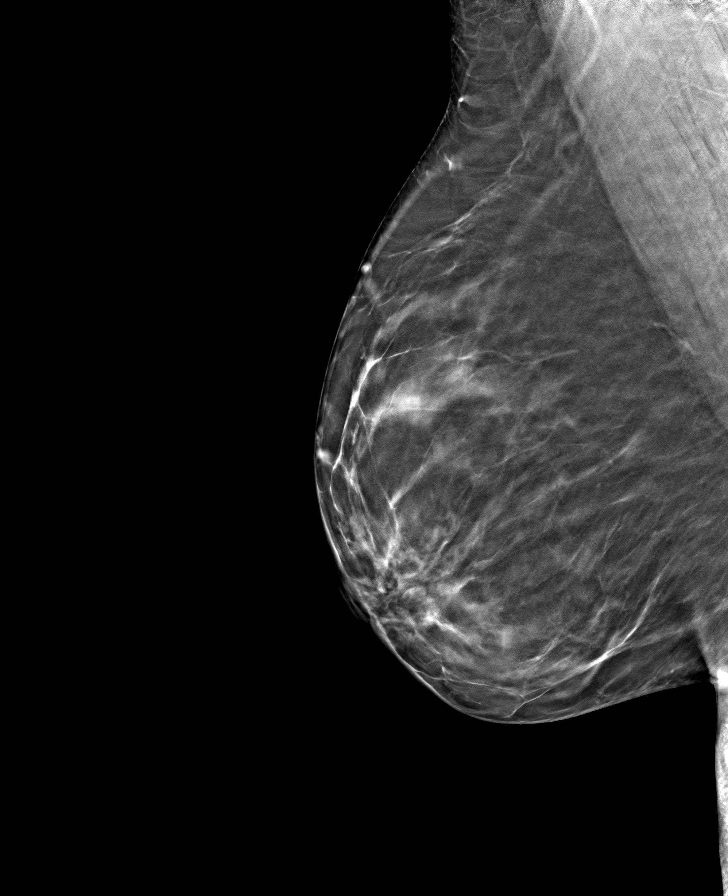

[L MLO tomo · tomo slice 38/75.0]
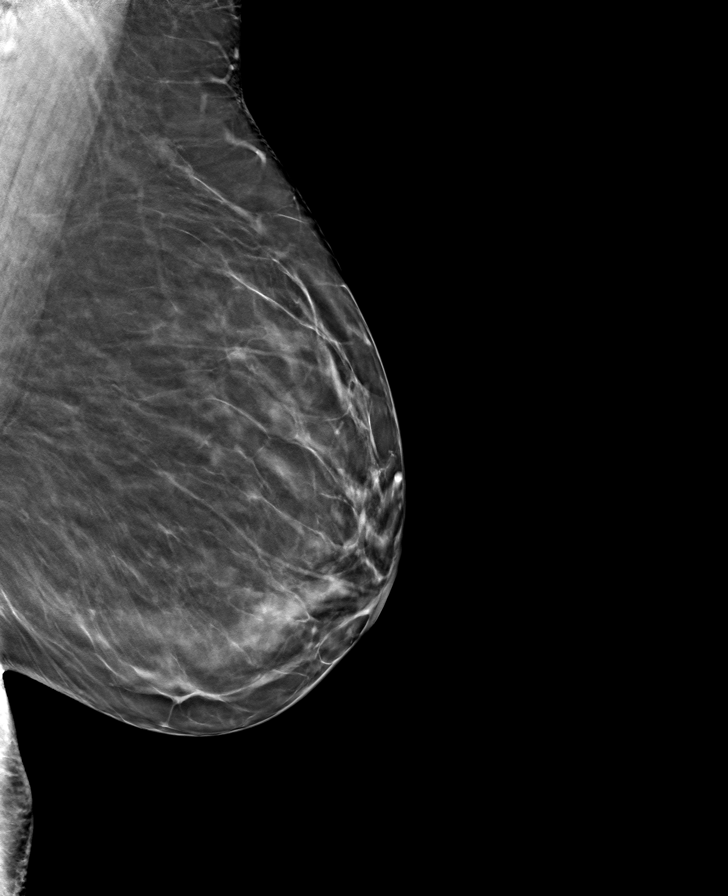

[R CC tomo · tomo slice 34/67.0]
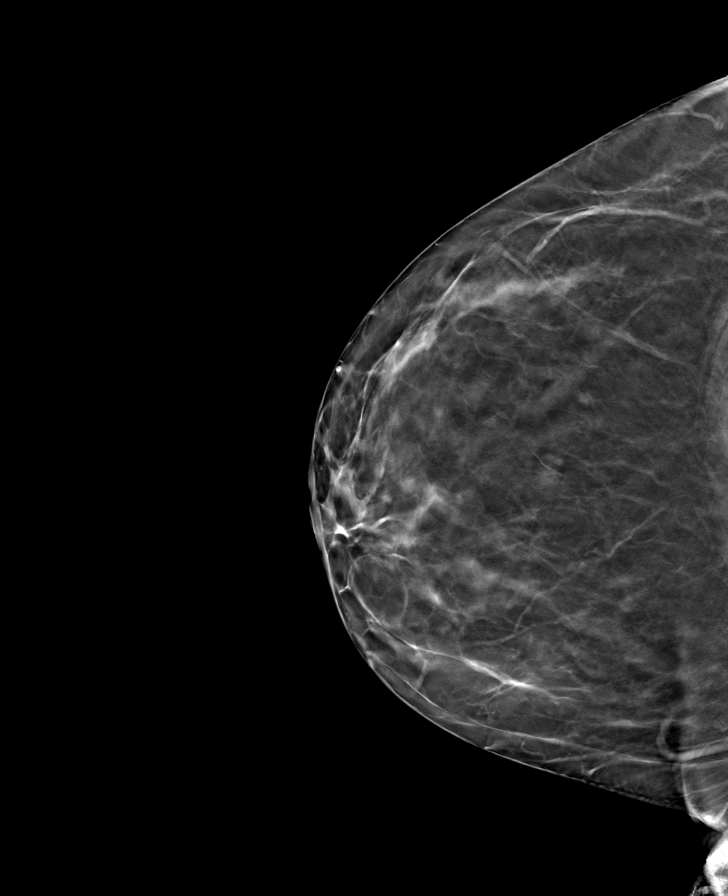

[8 of 24 positions shown; findings below may reference images not displayed]

ACR Breast Density Category b: There are scattered areas of
fibroglandular density.
FINDINGS: There are no findings suspicious for malignancy.
IMPRESSION: No mammographic evidence of malignancy. A result letter of this
screening mammogram will be mailed directly to the patient.

RECOMMENDATION:
Screening mammogram in one year. (Code:51-O-LD2)

BI-RADS CATEGORY  1: Negative.

## 2022-07-16 ENCOUNTER — Other Ambulatory Visit (HOSPITAL_BASED_OUTPATIENT_CLINIC_OR_DEPARTMENT_OTHER): Payer: Self-pay | Admitting: Family Medicine

## 2022-07-16 DIAGNOSIS — Z1231 Encounter for screening mammogram for malignant neoplasm of breast: Secondary | ICD-10-CM

## 2022-08-09 DIAGNOSIS — Z1329 Encounter for screening for other suspected endocrine disorder: Secondary | ICD-10-CM | POA: Diagnosis not present

## 2022-08-09 DIAGNOSIS — Z130 Encounter for screening for diseases of the blood and blood-forming organs and certain disorders involving the immune mechanism: Secondary | ICD-10-CM | POA: Diagnosis not present

## 2022-08-13 DIAGNOSIS — E782 Mixed hyperlipidemia: Secondary | ICD-10-CM | POA: Diagnosis not present

## 2022-08-13 DIAGNOSIS — R0683 Snoring: Secondary | ICD-10-CM | POA: Diagnosis not present

## 2022-08-13 DIAGNOSIS — Z0000 Encounter for general adult medical examination without abnormal findings: Secondary | ICD-10-CM | POA: Diagnosis not present

## 2022-08-13 DIAGNOSIS — F909 Attention-deficit hyperactivity disorder, unspecified type: Secondary | ICD-10-CM | POA: Diagnosis not present

## 2022-08-13 DIAGNOSIS — I10 Essential (primary) hypertension: Secondary | ICD-10-CM | POA: Diagnosis not present

## 2022-08-13 DIAGNOSIS — Z6827 Body mass index (BMI) 27.0-27.9, adult: Secondary | ICD-10-CM | POA: Diagnosis not present

## 2022-09-23 ENCOUNTER — Encounter: Payer: Self-pay | Admitting: Adult Health

## 2022-09-23 ENCOUNTER — Ambulatory Visit (INDEPENDENT_AMBULATORY_CARE_PROVIDER_SITE_OTHER): Payer: BC Managed Care – PPO | Admitting: Adult Health

## 2022-09-23 VITALS — BP 152/92 | HR 66 | Temp 98.5°F | Ht 64.5 in | Wt 166.8 lb

## 2022-09-23 DIAGNOSIS — G4733 Obstructive sleep apnea (adult) (pediatric): Secondary | ICD-10-CM

## 2022-09-23 DIAGNOSIS — R0683 Snoring: Secondary | ICD-10-CM

## 2022-09-23 NOTE — Patient Instructions (Signed)
Set up for home sleep study Healthy sleep regimen Do not drive if sleepy Work on healthy weight Follow-up in 3 months to discuss sleep study results and treatment plan

## 2022-09-23 NOTE — Progress Notes (Signed)
@Patient  ID: , female    DOB: 14-Sep-1971, 51 y.o.   MRN: [id number]  Chief Complaint  Patient presents with   Follow-up    Referring provider: [id number]  HPI: 51 year old female seen for sleep consult September 23, 2022 for restless sleep, snoring and daytime sleepiness  TEST/EVENTS :   09/23/2022 Sleep consult  Patient presents today for a sleep consult kindly referred by her primary care provider Dr. 14/08/2022.  Patient complains of snoring, restless sleep and daytime sleepiness.  Also lab work showed elevated hematocrit and hemoglobin.  Patient says she feels that she has never slept very well.  Typically goes to bed about p.m. to midnight.  Takes up to 30 minutes to go to sleep.  Is up at 6 AM. Wakes up tired .   Has never had sleep study before.  Weight is up 15 pounds over the last 2 years.  Current weight is at 176 pounds.  BMI is 28.  Blood pressure has been elevated recently.  She is following up with her primary care for this.  Epworth score is 12 out of 24.  Typically gets sleepy if she sits down to read or watch TV.  And in the evening hours.  No history of congestive heart failure or stroke.  No removable dental work Has ADD , on Adderall.  Does not take sleep aides .  Caffeine 2 cups coffee.     Medical history significant for hypertension, Anxiety , ADD.   Social history patient is divorced.  Lives with her son age 52. s a 12.   Drinks alcohol 3 times a week.  Never smoker.  No drug use.  Family history positive for allergies, asthma, RA , OSA   Surgical history C section .   No Known Allergies  Immunization History  Administered Date(s) Administered   Td 10/14/1998   Tdap 06/22/2013    Past Medical History:  Diagnosis Date   ADD (attention deficit disorder)    Anxiety    situational    Tobacco History: Social History   Tobacco Use  Smoking Status Never  Smokeless Tobacco Never   Counseling given: Not  Answered   Outpatient Medications Prior to Visit  Medication Sig Dispense Refill   ALPRAZolam (XANAX) 0.5 MG tablet Take 0.5 mg by mouth daily as needed.     amphetamine-dextroamphetamine (ADDERALL) 30 MG tablet Take by mouth.     medroxyPROGESTERone (PROVERA) 10 MG tablet Take 1 tablet (10 mg total) by mouth daily. 10 tablet 0   No facility-administered medications prior to visit.     Review of Systems:   Constitutional:   No  weight loss, night sweats,  Fevers, chills,  +fatigue, or  lassitude.  HEENT:   No headaches,  Difficulty swallowing,  Tooth/dental problems, or  Sore throat,                No sneezing, itching, ear ache, nasal congestion, post nasal drip,   CV:  No chest pain,  Orthopnea, PND, swelling in lower extremities, anasarca, dizziness, palpitations, syncope.   GI  No heartburn, indigestion, abdominal pain, nausea, vomiting, diarrhea, change in bowel habits, loss of appetite, bloody stools.   Resp: No shortness of breath with exertion or at rest.  No excess mucus, no productive cough,  No non-productive cough,  No coughing up of blood.  No change in color of mucus.  No wheezing.  No chest wall deformity  Skin: no rash or lesions.  GU: no dysuria, change in color of urine, no urgency or frequency.  No flank pain, no hematuria   MS:  No joint pain or swelling.  No decreased range of motion.  No back pain.    Physical Exam  BP (!) 152/92 (BP Location: Left Arm, Cuff Size: Normal)   Pulse 66   Temp 98.5 F (36.9 C) (Oral)   Ht 5' 4.5" (1.638 m)   Wt 166 lb 12.8 oz (75.7 kg)   SpO2 99%   BMI 28.19 kg/m   GEN: A/Ox3; pleasant , NAD, well nourished    HEENT:  [place]/AT,  EACs-clear, TMs-wnl, NOSE-clear, THROAT-clear, no lesions, no postnasal drip or exudate noted. Class 2-3 MP airway   NECK:  Supple w/ fair ROM; no JVD; normal carotid impulses w/o bruits; no thyromegaly or nodules palpated; no lymphadenopathy.    RESP  Clear  P & A; w/o, wheezes/ rales/ or  rhonchi. no accessory muscle use, no dullness to percussion  CARD:  RRR, no m/r/g, no peripheral edema, pulses intact, no cyanosis or clubbing.  GI:   Soft & nt; nml bowel sounds; no organomegaly or masses detected.   Musco: Warm bil, no deformities or joint swelling noted.   Neuro: alert, no focal deficits noted.    Skin: Warm, no lesions or rashes    Lab Results:   BNP No results found for: "BNP"  ProBNP No results found for: "PROBNP"  Imaging: No results found.        No data to display          No results found for: "NITRICOXIDE"      Assessment & Plan:   Snoring Snoring, restless sleep, daytime sleepiness, polycythemia all concerning for possible underlying sleep apnea.  Will set patient up for home sleep study.  Patient education given on sleep apnea.  - discussed how weight can impact sleep and risk for sleep disordered breathing - discussed options to assist with weight loss: combination of diet modification, cardiovascular and strength training exercises   - had an extensive discussion regarding the adverse health consequences related to untreated sleep disordered breathing - specifically discussed the risks for hypertension, coronary artery disease, cardiac dysrhythmias, cerebrovascular disease, and diabetes - lifestyle modification discussed   - discussed how sleep disruption can increase risk of accidents, particularly when driving - safe driving practices were discussed   Plan  Patient Instructions  Set up for home sleep study Healthy sleep regimen Do not drive if sleepy Work on healthy weight Follow-up in 3 months to discuss sleep study results and treatment plan      Rubye Oaks, NP 09/23/2022

## 2022-09-23 NOTE — Progress Notes (Signed)
Reviewed and agree with assessment/plan.   Jennavie Martinek, MD [facility] 09/23/2022, 10:39 AM Pager:  [phone]  

## 2022-09-23 NOTE — Assessment & Plan Note (Signed)
Snoring, restless sleep, daytime sleepiness, polycythemia all concerning for possible underlying sleep apnea.  Will set patient up for home sleep study.  Patient education given on sleep apnea.  - discussed how weight can impact sleep and risk for sleep disordered breathing - discussed options to assist with weight loss: combination of diet modification, cardiovascular and strength training exercises   - had an extensive discussion regarding the adverse health consequences related to untreated sleep disordered breathing - specifically discussed the risks for hypertension, coronary artery disease, cardiac dysrhythmias, cerebrovascular disease, and diabetes - lifestyle modification discussed   - discussed how sleep disruption can increase risk of accidents, particularly when driving - safe driving practices were discussed   Plan  Patient Instructions  Set up for home sleep study Healthy sleep regimen Do not drive if sleepy Work on healthy weight Follow-up in 3 months to discuss sleep study results and treatment plan

## 2022-10-11 ENCOUNTER — Ambulatory Visit (HOSPITAL_BASED_OUTPATIENT_CLINIC_OR_DEPARTMENT_OTHER): Payer: BC Managed Care – PPO | Admitting: Obstetrics & Gynecology

## 2022-10-11 ENCOUNTER — Inpatient Hospital Stay (HOSPITAL_BASED_OUTPATIENT_CLINIC_OR_DEPARTMENT_OTHER): Admission: RE | Admit: 2022-10-11 | Payer: BC Managed Care – PPO | Source: Ambulatory Visit | Admitting: Radiology

## 2022-10-30 ENCOUNTER — Encounter: Payer: Self-pay | Admitting: Adult Health

## 2022-12-09 ENCOUNTER — Telehealth: Payer: Self-pay | Admitting: Adult Health

## 2022-12-09 NOTE — Telephone Encounter (Signed)
Patient checking on scheduling home sleep test. Patient phone number is [phone].

## 2022-12-10 NOTE — Telephone Encounter (Signed)
Left detailed message for the patient.

## 2022-12-10 NOTE — Telephone Encounter (Signed)
Order was placed in [employer] [facility] people on 12/02/2022 they will do the Auth then contact the patient looks like the turn around time is pretty quick just depends on how long it takes to get the [place]

## 2022-12-23 ENCOUNTER — Ambulatory Visit: Payer: BC Managed Care – PPO | Admitting: Adult Health

## 2023-01-20 DIAGNOSIS — F411 Generalized anxiety disorder: Secondary | ICD-10-CM | POA: Diagnosis not present

## 2023-01-20 DIAGNOSIS — F900 Attention-deficit hyperactivity disorder, predominantly inattentive type: Secondary | ICD-10-CM | POA: Diagnosis not present

## 2023-02-12 DIAGNOSIS — G4730 Sleep apnea, unspecified: Secondary | ICD-10-CM | POA: Insufficient documentation

## 2023-02-24 ENCOUNTER — Encounter (HOSPITAL_BASED_OUTPATIENT_CLINIC_OR_DEPARTMENT_OTHER): Payer: Self-pay | Admitting: Obstetrics & Gynecology

## 2023-02-24 ENCOUNTER — Other Ambulatory Visit (HOSPITAL_COMMUNITY)
Admission: RE | Admit: 2023-02-24 | Discharge: 2023-02-24 | Disposition: A | Discharge status: Home / Self Care | Payer: BC Managed Care – PPO | Source: Ambulatory Visit | Attending: Obstetrics & Gynecology | Admitting: Obstetrics & Gynecology

## 2023-02-24 ENCOUNTER — Ambulatory Visit (HOSPITAL_BASED_OUTPATIENT_CLINIC_OR_DEPARTMENT_OTHER): Payer: BC Managed Care – PPO | Admitting: Radiology

## 2023-02-24 ENCOUNTER — Ambulatory Visit (INDEPENDENT_AMBULATORY_CARE_PROVIDER_SITE_OTHER): Payer: BC Managed Care – PPO | Admitting: Obstetrics & Gynecology

## 2023-02-24 VITALS — BP 157/99 | HR 69 | Ht 64.5 in | Wt 164.4 lb

## 2023-02-24 DIAGNOSIS — Z658 Other specified problems related to psychosocial circumstances: Secondary | ICD-10-CM

## 2023-02-24 DIAGNOSIS — R030 Elevated blood-pressure reading, without diagnosis of hypertension: Secondary | ICD-10-CM

## 2023-02-24 DIAGNOSIS — N951 Menopausal and female climacteric states: Secondary | ICD-10-CM

## 2023-02-24 DIAGNOSIS — D582 Other hemoglobinopathies: Secondary | ICD-10-CM

## 2023-02-24 DIAGNOSIS — Z124 Encounter for screening for malignant neoplasm of cervix: Secondary | ICD-10-CM | POA: Diagnosis present

## 2023-02-24 DIAGNOSIS — Z01419 Encounter for gynecological examination (general) (routine) without abnormal findings: Secondary | ICD-10-CM

## 2023-02-24 MED ORDER — SERTRALINE HCL 100 MG PO TABS: ORAL_TABLET | ORAL | 1 refills | Status: DC

## 2023-02-24 NOTE — Progress Notes (Signed)
52 y.o. G1P1 Divorced White or Caucasian female here for annual exam.  Still have cycles but skipping months.  Last one was March.  Longest cycle has been 3 days.  Flow can be heavier if skips several months.  This only lasts one day.    Sexually active: No.  The current method of family planning is none.    Exercising: no Smoker:  no  Health Maintenance: Pap:  2021 History of abnormal Pap:  no MMG:  06/2021 Colonoscopy:  06/2021 BMD:   guidelines reviewed Screening Labs: done 02/12/2023.  Hb was elevated.     reports that she has never smoked. She has never used smokeless tobacco. She reports current alcohol use of about 4.0 standard drinks of alcohol per week. She reports that she does not use drugs.  Past Medical History:  Diagnosis Date   ADD (attention deficit disorder)    Anxiety    situational    Past Surgical History:  Procedure Laterality Date   CESAREAN SECTION  05/06/06    Current Outpatient Medications  Medication Sig Dispense Refill   ALPRAZolam (XANAX) 0.5 MG tablet Take 0.5 mg by mouth daily as needed.     amphetamine-dextroamphetamine (ADDERALL) 30 MG tablet Take by mouth.     No current facility-administered medications for this visit.    Family History  Problem Relation Age of Onset   Rheum arthritis Mother    Asthma Mother    Raynaud syndrome Sister    Heart failure Paternal Grandfather    Hypertension Brother     ROS: Constitutional: negative Genitourinary:negative  Exam:   BP (!) 157/99 (BP Location: Right Arm, Patient Position: Sitting, Cuff Size: Large)   Pulse 69   Ht 5' 4.5" (1.638 m) Comment: Reported  Wt 164 lb 6.4 oz (74.6 kg)   LMP 12/31/2022   BMI 27.78 kg/m   Height: 5' 4.5" (163.8 cm) (Reported)  General appearance: alert, cooperative and appears stated age Head: Normocephalic, without obvious abnormality, atraumatic Neck: no adenopathy, supple, symmetrical, trachea midline and thyroid normal to inspection and palpation Lungs:  clear to auscultation bilaterally Breasts: normal appearance, no masses or tenderness Heart: regular rate and rhythm Abdomen: soft, non-tender; bowel sounds normal; no masses,  no organomegaly Extremities: extremities normal, atraumatic, no cyanosis or edema Skin: Skin color, texture, turgor normal. No rashes or lesions Lymph nodes: Cervical, supraclavicular, and axillary nodes normal. No abnormal inguinal nodes palpated Neurologic: Grossly normal   Pelvic: External genitalia:  no lesions              Urethra:  normal appearing urethra with no masses, tenderness or lesions              Bartholins and Skenes: normal                 Vagina: normal appearing vagina with normal color and no discharge, no lesions              Cervix: no lesions              Pap taken: Yes.   Bimanual Exam:  Uterus:  normal size, contour, position, consistency, mobility, non-tender              Adnexa: normal adnexa and no mass, fullness, tenderness               Rectovaginal: Confirms               Anus:  normal sphincter tone, no lesions  Chaperone, Ina Homes, CMA, was present for exam.  Assessment/Plan: 1. Well woman exam with routine gynecological exam - Pap smear with HR HPV obtained today - Mammogram was scheduled today but had to  - Colonoscopy 2022 - Bone mineral density not indicated yet - lab work done with Dr. Fortunato Curling office - vaccines reviewed/updated  2. Cervical cancer screening - Cytology - PAP( [place])  3. Elevated hemoglobin (HCC) - baby ASA recommended until evaluation is completed  4. Perimenopausal - guidelines for calling with bleeding discussed  5. Elevated blood pressure reading

## 2023-02-25 LAB — CYTOLOGY - PAP
Comment: NEGATIVE
Diagnosis: NEGATIVE
High risk HPV: NEGATIVE

## 2023-05-18 ENCOUNTER — Other Ambulatory Visit (HOSPITAL_BASED_OUTPATIENT_CLINIC_OR_DEPARTMENT_OTHER): Payer: Self-pay | Admitting: Obstetrics & Gynecology

## 2023-05-18 DIAGNOSIS — Z658 Other specified problems related to psychosocial circumstances: Secondary | ICD-10-CM

## 2023-08-28 ENCOUNTER — Other Ambulatory Visit (HOSPITAL_BASED_OUTPATIENT_CLINIC_OR_DEPARTMENT_OTHER): Payer: Self-pay

## 2023-08-28 DIAGNOSIS — Z658 Other specified problems related to psychosocial circumstances: Secondary | ICD-10-CM

## 2023-08-28 MED ORDER — SERTRALINE HCL 100 MG PO TABS: ORAL_TABLET | ORAL | 0 refills | Status: DC

## 2023-12-01 ENCOUNTER — Other Ambulatory Visit (HOSPITAL_BASED_OUTPATIENT_CLINIC_OR_DEPARTMENT_OTHER): Payer: Self-pay | Admitting: *Deleted

## 2023-12-01 DIAGNOSIS — Z658 Other specified problems related to psychosocial circumstances: Secondary | ICD-10-CM

## 2023-12-01 MED ORDER — SERTRALINE HCL 100 MG PO TABS: ORAL_TABLET | ORAL | 2 refills | Status: DC

## 2024-03-01 ENCOUNTER — Encounter (HOSPITAL_BASED_OUTPATIENT_CLINIC_OR_DEPARTMENT_OTHER): Payer: Self-pay | Admitting: Obstetrics & Gynecology

## 2024-03-01 ENCOUNTER — Ambulatory Visit (HOSPITAL_BASED_OUTPATIENT_CLINIC_OR_DEPARTMENT_OTHER): Payer: Self-pay | Admitting: Obstetrics & Gynecology

## 2024-03-01 VITALS — BP 138/90 | HR 65 | Ht 64.5 in | Wt 161.8 lb

## 2024-03-01 DIAGNOSIS — N926 Irregular menstruation, unspecified: Secondary | ICD-10-CM

## 2024-03-01 DIAGNOSIS — Z01419 Encounter for gynecological examination (general) (routine) without abnormal findings: Secondary | ICD-10-CM

## 2024-03-01 DIAGNOSIS — Z1231 Encounter for screening mammogram for malignant neoplasm of breast: Secondary | ICD-10-CM

## 2024-03-01 DIAGNOSIS — Z658 Other specified problems related to psychosocial circumstances: Secondary | ICD-10-CM | POA: Diagnosis not present

## 2024-03-01 DIAGNOSIS — N852 Hypertrophy of uterus: Secondary | ICD-10-CM | POA: Diagnosis not present

## 2024-03-01 MED ORDER — SERTRALINE HCL 50 MG PO TABS: 50.0000 mg | ORAL_TABLET | Freq: Every day | ORAL | 4 refills | Status: AC

## 2024-03-01 MED ORDER — NORETHINDRONE 0.35 MG PO TABS: 1.0000 | ORAL_TABLET | Freq: Every day | ORAL | 3 refills | Status: AC

## 2024-03-01 NOTE — Progress Notes (Signed)
 ANNUAL EXAM Patient name: Kristen Wood MRN [id number]  Date of birth: 1971-07-28 Chief Complaint:   Annual Exam  History of Present Illness:   Kristen Wood is a 53 y.o. G1P1  female being seen today for a routine annual exam.  Menstrual cycles continue to be less frequent.  Has skipped three to four months between cycles.  Flow lasts 3 - 4 days.  It can be heavy the first day or two and then tapers off quickly.  Discussed treatment.  She would like to start progesterone.  Rx will be sent to pharmacy.    Started sertraline  last year.  Feels this has really helped.  I wrote 100mg  dosage but has just been taking 50mg  so will do rx for this dosage.    Last pap:  02/24/2023 Mammogram:  06/27/2021 Colonoscopy: 2022.  Follow up 10 years   Patient's last menstrual period was 02/02/2024.     Review of Systems:   Pertinent items are noted in HPI  Denies any urinary changes.  Having  more flatus.   Pertinent History Reviewed:  Reviewed past medical,surgical, social and family history.  Reviewed problem list, medications and allergies. Physical Assessment:   Vitals:   03/01/24 0920 03/01/24 1025  BP: (!) 151/98 (!) 138/90  Pulse: 65   Weight: 161 lb 12.8 oz (73.4 kg)   Height: 5' 4.5" (1.638 m)   Body mass index is 27.34 kg/m.        Physical Examination:   General appearance - well appearing, and in no distress  Mental status - alert, oriented to person, place, and time  Psych:  She has a normal mood and affect  Skin - warm and dry, normal color, no suspicious lesions noted  Chest - effort normal, all lung fields clear to auscultation bilaterally  Heart - normal rate and regular rhythm  Neck:  midline trachea, no thyromegaly or nodules  Breasts - breasts appear normal, no suspicious masses, no skin or nipple changes or  axillary nodes  Abdomen - soft, nontender, nondistended, no masses or organomegaly  Pelvic - VULVA: normal appearing vulva with no masses, tenderness or  lesions   VAGINA: normal appearing vagina with normal color and discharge, no lesions   CERVIX: normal appearing cervix without discharge or lesions, no CMT  Thin prep pap is not done.  UTERUS: uterus is felt to be normal size, shape, consistency and nontender   ADNEXA: No adnexal masses or tenderness noted.  Rectal - normal rectal, good sphincter tone, no masses felt.   Extremities:  No swelling or varicosities noted  Chaperone present for exam  Assessment & Plan:  1. Well woman exam with routine gynecological exam (Primary) - Pap smear 02/24/2023 - Mammogram 06/27/2021 - Colonoscopy 2022.  Follow up 10 years. - vaccines reviewed/update  2. Irregular bleeding - norethindrone  (MICRONOR ) 0.35 MG tablet; Take 1 tablet (0.35 mg total) by mouth daily.  Dispense: 84 tablet; Refill: 3  3. Encounter for screening mammogram for malignant neoplasm of breast - [facility] 3D SCREENING MAMMOGRAM BILATERAL BREAST; Future  4. Psychosocial stressors - sertraline  (ZOLOFT ) 50 MG tablet; Take 1 tablet (50 mg total) by mouth daily.  Dispense: 90 tablet; Refill: 4  5. Enlarged uterus - US  PELVIS TRANSVAGINAL NON-OB (TV ONLY); Future   Orders Placed This Encounter  Procedures   MM 3D SCREENING MAMMOGRAM BILATERAL BREAST   US  PELVIS TRANSVAGINAL NON-OB (TV ONLY)    Meds:  Meds ordered this encounter  Medications  norethindrone  (MICRONOR ) 0.35 MG tablet    Sig: Take 1 tablet (0.35 mg total) by mouth daily.    Dispense:  84 tablet    Refill:  3   sertraline  (ZOLOFT ) 50 MG tablet    Sig: Take 1 tablet (50 mg total) by mouth daily.    Dispense:  90 tablet    Refill:  4    Follow-up: Return for for ultrasound.  Lillian Rein, MD 03/04/2024 6:42 PM

## 2024-03-17 ENCOUNTER — Ambulatory Visit (HOSPITAL_BASED_OUTPATIENT_CLINIC_OR_DEPARTMENT_OTHER)

## 2024-03-17 ENCOUNTER — Ambulatory Visit (HOSPITAL_BASED_OUTPATIENT_CLINIC_OR_DEPARTMENT_OTHER): Admitting: Obstetrics & Gynecology

## 2024-03-17 ENCOUNTER — Encounter (HOSPITAL_BASED_OUTPATIENT_CLINIC_OR_DEPARTMENT_OTHER): Payer: Self-pay | Admitting: Obstetrics & Gynecology

## 2024-03-17 VITALS — BP 150/90 | HR 70 | Ht 64.5 in | Wt 160.0 lb

## 2024-03-17 DIAGNOSIS — D251 Intramural leiomyoma of uterus: Secondary | ICD-10-CM | POA: Diagnosis not present

## 2024-03-17 DIAGNOSIS — N852 Hypertrophy of uterus: Secondary | ICD-10-CM | POA: Diagnosis not present

## 2024-03-17 DIAGNOSIS — R030 Elevated blood-pressure reading, without diagnosis of hypertension: Secondary | ICD-10-CM

## 2024-03-20 NOTE — Progress Notes (Signed)
 GYNECOLOGY  VISIT  CC:   Discuss ultrasound results, enlarged uterus on exam  HPI: 53 y.o. G1P1 Divorced White or Caucasian female here for discussion of ultrasound results.  Ultrasound obtained due to enlarged uterus on exam.  Uterus is mildly enlarged measuring 9.2 x 5.5 x 5.1cm with fibroids measuring 3cm, 2cm and 2.2cm.  Pt is not have abnormal bleeding as cycles are regular and lasts 3-4 days.  Not having heavy bleeding either.  Recommend monitoring conservatively and recheck next year with physical exam.  She is on micronor  and will continue.     Past Medical History:  Diagnosis Date   ADD (attention deficit disorder)    Anxiety    situational    MEDS:   Current Outpatient Medications on File Prior to Visit  Medication Sig Dispense Refill   ALPRAZolam (XANAX) 0.5 MG tablet Take 0.5 mg by mouth daily as needed.     amphetamine-dextroamphetamine (ADDERALL) 30 MG tablet Take by mouth.     norethindrone  (MICRONOR ) 0.35 MG tablet Take 1 tablet (0.35 mg total) by mouth daily. 84 tablet 3   sertraline  (ZOLOFT ) 50 MG tablet Take 1 tablet (50 mg total) by mouth daily. 90 tablet 4   No current facility-administered medications on file prior to visit.    ALLERGIES: Patient has no known allergies.  SH:  divorced, non smoker  Review of Systems  Constitutional: Negative.   Genitourinary: Negative.     PHYSICAL EXAMINATION:    BP (!) 150/90   Pulse 70   Ht 5' 4.5" (1.638 m)   Wt 160 lb (72.6 kg)   LMP 02/02/2024   BMI 27.04 kg/m      Physical Exam Constitutional:      Appearance: Normal appearance.  Neurological:     General: No focal deficit present.  Psychiatric:        Mood and Affect: Mood normal.     Assessment/Plan: 1. Fibroids, intramural (Primary) - common symptoms and benign nature discussed  - will follow with physical exam at [facility] next year - if bleeding changes, advised pt to call  2. Elevated blood pressure reading - recommended she get a BP cuff to  start checking at home
